# Patient Record
Sex: Male | Born: 1995 | Race: Black or African American | Hispanic: No | Marital: Single | Smoking: Never smoker
Health system: Southern US, Community
[De-identification: ages and names within clinical notes are randomized; demographics above are authoritative.]

---

## 2014-08-28 ENCOUNTER — Encounter (HOSPITAL_COMMUNITY): Payer: Self-pay | Admitting: Emergency Medicine

## 2014-08-28 ENCOUNTER — Emergency Department (HOSPITAL_COMMUNITY): Payer: Medicaid Other

## 2014-08-28 ENCOUNTER — Emergency Department (HOSPITAL_COMMUNITY)
Admission: EM | Admit: 2014-08-28 | Discharge: 2014-08-28 | Disposition: A | Discharge status: Home / Self Care | Payer: Medicaid Other | Attending: Emergency Medicine | Admitting: Emergency Medicine

## 2014-08-28 DIAGNOSIS — S02609B Fracture of mandible, unspecified, initial encounter for open fracture: Secondary | ICD-10-CM | POA: Diagnosis not present

## 2014-08-28 DIAGNOSIS — S0993XA Unspecified injury of face, initial encounter: Secondary | ICD-10-CM

## 2014-08-28 DIAGNOSIS — IMO0002 Reserved for concepts with insufficient information to code with codable children: Secondary | ICD-10-CM | POA: Diagnosis not present

## 2014-08-28 DIAGNOSIS — S0083XA Contusion of other part of head, initial encounter: Secondary | ICD-10-CM | POA: Insufficient documentation

## 2014-08-28 DIAGNOSIS — S1093XA Contusion of unspecified part of neck, initial encounter: Secondary | ICD-10-CM

## 2014-08-28 DIAGNOSIS — S0003XA Contusion of scalp, initial encounter: Secondary | ICD-10-CM | POA: Diagnosis not present

## 2014-08-28 DIAGNOSIS — R220 Localized swelling, mass and lump, head: Secondary | ICD-10-CM

## 2014-08-28 DIAGNOSIS — S025XXA Fracture of tooth (traumatic), initial encounter for closed fracture: Secondary | ICD-10-CM | POA: Insufficient documentation

## 2014-08-28 MED ORDER — HYDROCODONE-ACETAMINOPHEN 5-325 MG PO TABS: 1.0000 | ORAL_TABLET | Freq: Four times a day (QID) | ORAL | Status: DC | PRN

## 2014-08-28 MED ORDER — AMOXICILLIN 500 MG PO TABS: 500.0000 mg | ORAL_TABLET | Freq: Two times a day (BID) | ORAL | Status: DC

## 2014-08-28 MED ORDER — NAPROXEN 500 MG PO TABS: 500.0000 mg | ORAL_TABLET | Freq: Two times a day (BID) | ORAL | Status: DC | PRN

## 2014-08-28 MED ORDER — HYDROCODONE-ACETAMINOPHEN 5-325 MG PO TABS
1.0000 | ORAL_TABLET | Freq: Once | ORAL | Status: AC
  Administered 2014-08-28: 1 via ORAL
  Filled 2014-08-28: qty 1
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
## 2014-08-28 NOTE — ED Notes (Signed)
Pt alert and oriented at discharge.  Pt ambulatory to the waiting room with this RN.  Taken home by friend.
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
## 2014-08-28 NOTE — Procedures (Signed)
*   No surgery found *  8:51 AM  PATIENT: Lonnie Underwood  17 y.o.     PROCEDURE: CLOSED REDUCTION MANDIBLE FRACTURE, STABILIZATION AVULSED TEETH, SUTURE ORAL LACERATION   SURGEON:  Philopater Mucha M. Piper [place]sebrock, dmd  ANESTHESIA:   local   Description of procedure: Local anesthesia 2% lidocaine 1:200,000 epi x 8 cc bilateral mandibular blocks, anterior infiltration. Teeth repositioned into sockets under manual pressure. Alveolar fracture reduced manually. Removed loose bracket tooth #23. 24 ga. Wire stabilization between 23 and 24. Sutured gingival interproximal tissue to close open fracture in anterior incisor area with 4-0 chromic. Tolerated well. No complications. Post op instructions given.  PLAN OF CARE: Discharge to home from ER  PATIENT DISPOSITION:  PACU - hemodynamically stable.   PROCEDURE DETAILS: Dictation #  Georgia Lopes, DMD 08/28/2014 8:51 AM

## 2014-08-29 NOTE — ED Provider Notes (Signed)
I agree with plan, surgeon evaluated in ED.  See separate note.   Lonnie Underwood   Lonnie Skeens, MD 08/29/14 [phone]

## 2016-02-29 ENCOUNTER — Encounter (HOSPITAL_COMMUNITY): Payer: Self-pay | Admitting: Emergency Medicine

## 2016-02-29 ENCOUNTER — Emergency Department (HOSPITAL_COMMUNITY)
Admission: EM | Admit: 2016-02-29 | Discharge: 2016-02-29 | Disposition: A | Discharge status: Home / Self Care | Payer: Medicaid Other | Attending: Emergency Medicine | Admitting: Emergency Medicine

## 2016-02-29 DIAGNOSIS — K0889 Other specified disorders of teeth and supporting structures: Secondary | ICD-10-CM | POA: Diagnosis present

## 2016-02-29 DIAGNOSIS — K120 Recurrent oral aphthae: Secondary | ICD-10-CM | POA: Diagnosis not present

## 2016-02-29 MED ORDER — BENZOCAINE 20 % MT GEL: 1.0000 "application " | Freq: Three times a day (TID) | OROMUCOSAL | Status: DC | PRN

## 2016-02-29 NOTE — ED Notes (Signed)
Pt presents to ed for evaluation of a sore developing in his mouth in the mid, lower gums.  NAD

## 2016-02-29 NOTE — ED Notes (Signed)
Patient able to ambulate independently  

## 2016-02-29 NOTE — ED Provider Notes (Signed)
CSN: [id number]     Arrival date & time 02/29/16  1928 History  By signing my name below, I, Soijett Blue, attest that this documentation has been prepared under the direction and in the presence of Will Sophiah Rolin, PA-C Electronically Signed: Soijett Blue, ED Scribe. 02/29/2016. 8:00 PM.   Chief Complaint  Patient presents with  . Dental Problem     The history is provided by the patient. No language interpreter was used.    Lonnie Underwood is a 20 y.o. male who presents to the Emergency Department complaining of dental problem onset 1.5 weeks. He notes that he has canker sores to his bottom inner lip. Pt has had similar symptoms 2 years ago that resolved. Pt has his braces checked by Dr. Cliffton Asters in [place], [place] with his last visit being last month. He states that has tried warm salt water gargles without any medications for the relief for his symptoms. He denies gum swelling/bleeding, dental pain, sore throat, fever, chills, neck pain, ear pain, eye pain, and any other symptoms.    History reviewed. No pertinent past medical history. History reviewed. No pertinent past surgical history. History reviewed. No pertinent family history. Social History  Substance Use Topics  . Smoking status: Never Smoker   . Smokeless tobacco: Never Used  . Alcohol Use: Yes     Comment: occasionally    Review of Systems  Constitutional: Negative for fever and chills.  HENT: Positive for dental problem and mouth sores (canker). Negative for ear pain, facial swelling, sore throat and trouble swallowing.   Eyes: Negative for pain.  Skin: Negative for rash.    Allergies  Review of patient's allergies indicates no known allergies.  Home Medications   Prior to Admission medications   Medication Sig Start Date End Date Taking? Authorizing Provider  amoxicillin (AMOXIL) 500 MG tablet Take 1 tablet (500 mg total) by mouth 2 (two) times daily. For 10 days 08/28/14   Mercedes Camprubi-Soms, PA-C  benzocaine (ORABASE  MAXIMUM STRENGTH) 20 % GEL Use as directed 1 application in the mouth or throat 3 (three) times daily as needed. 02/29/16   Everlene Farrier, PA-C  HYDROcodone-acetaminophen (NORCO) 5-325 MG per tablet Take 1-2 tablets by mouth every 6 (six) hours as needed for severe pain. 08/28/14   Mercedes Camprubi-Soms, PA-C  naproxen (NAPROSYN) 500 MG tablet Take 1 tablet (500 mg total) by mouth 2 (two) times daily as needed for mild pain, moderate pain or headache (TAKE WITH MEALS.). 08/28/14   Mercedes Camprubi-Soms, PA-C   BP 143/74 mmHg  Pulse 64  Temp(Src) 98.5 F (36.9 C) (Oral)  Resp 16  SpO2 100% Physical Exam  Constitutional: He appears well-developed and well-nourished. No distress.  Nontoxic appearing.  HENT:  Head: Normocephalic and atraumatic.  Right Ear: External ear normal.  Left Ear: External ear normal.  Nose: Nose normal.  Mouth/Throat: Oropharynx is clear and moist. No oropharyngeal exudate.   Several small localized, shallow, round to oval ulcers with a grayish base to his lower lip inner mucosa.  Patient has braces. No tonsillar hypertrophy or exudate. Uvula is midline without edema. No peritonsillar abscess. No trismus. No drooling. Tongue protrusion is is normal.  Eyes: Conjunctivae are normal. Pupils are equal, round, and reactive to light. Right eye exhibits no discharge. Left eye exhibits no discharge.  Neck: Normal range of motion. Neck supple. No JVD present. No tracheal deviation present.  Cardiovascular: Normal rate, regular rhythm and intact distal pulses.   Pulmonary/Chest: Effort normal. No respiratory  distress.  Lymphadenopathy:    He has no cervical adenopathy.  Neurological: He is alert. Coordination normal.  Skin: Skin is warm and dry. No rash noted. He is not diaphoretic. No erythema. No pallor.  Psychiatric: He has a normal mood and affect. His behavior is normal.  Nursing note and vitals reviewed.   ED Course  Procedures (including critical care  time) DIAGNOSTIC STUDIES: Oxygen Saturation is 100% on RA, nl by my interpretation.    COORDINATION OF CARE: 7:51 PM Discussed treatment plan with pt at bedside which includes benzocaine Rx and pt agreed to plan.    Labs Review Labs Reviewed - No data to display  Imaging Review No results found.    EKG Interpretation None      Filed Vitals:   02/29/16 1932  BP: 143/74  Pulse: 64  Temp: 98.5 F (36.9 C)  TempSrc: Oral  Resp: 16  SpO2: 100%     MDM   Meds given in ED:  Medications - No data to display  Discharge Medication List as of 02/29/2016  7:59 PM    START taking these medications   Details  benzocaine (ORABASE MAXIMUM STRENGTH) 20 % GEL Use as directed 1 application in the mouth or throat 3 (three) times daily as needed., Starting 02/29/2016, Until Discontinued, Print        Final diagnoses:  Aphthous ulcer of mouth   This is a 20 y.o. male who presents to the Emergency Department complaining of dental problem onset 1.5 weeks. He notes that he has canker sores to his bottom inner lip. Pt has had similar symptoms 2 years ago that resolved. Pt has his braces checked by Dr. Cliffton AstersWhite in [place], [place] with his last visit being last month.  On exam the patient is afebrile nontoxic appearing. Patient has several aphthous ulcers noted to his lower lip mucosa. No other lesions noted. I educated on aphthous ulcers. Will provide with prescription for Orabase. I encouraged him to follow-up with his orthodontist and dentist. I advised the patient to follow-up with their primary care provider this week. I advised the patient to return to the emergency department with new or worsening symptoms or new concerns. The patient verbalized understanding and agreement with plan.    I personally performed the services described in this documentation, which was scribed in my presence. The recorded information has been reviewed and is accurate.        Everlene FarrierWilliam Bani Gianfrancesco, PA-C 02/29/16  2008  Pricilla LovelessScott Goldston, MD 03/06/16 [phone]

## 2016-02-29 NOTE — Discharge Instructions (Signed)
Canker Sores °Canker sores are small, painful sores that develop inside your mouth. They may also be called aphthous ulcers. You can get canker sores on the inside of your lips or cheeks, on your tongue, or anywhere inside your mouth. You can have just one canker sore or several of them. Canker sores cannot be passed from one person to another (noncontagious). These sores are different than the sores that you may get on the outside of your lips (cold sores or fever blisters). °Canker sores usually start as painful red bumps. Then they turn into small white, yellow, or gray ulcers that have red borders. The ulcers may be quite painful. The pain may be worse when you eat or drink. °CAUSES °The cause of this condition is not known. °RISK FACTORS °This condition is more likely to develop in: °· Women. °· People in their teens or 20s. °· Women who are having their menstrual period. °· People who are under a lot of emotional stress. °· People who do not get enough iron or B vitamins. °· People who have poor oral hygiene. °· People who have an injury inside the mouth. This can happen after having dental work or from chewing something hard. °SYMPTOMS °Along with the canker sore, symptoms may also include: °· Fever. °· Fatigue. °· Swollen lymph nodes in your neck. °DIAGNOSIS °This condition can be diagnosed based on your symptoms. Your health care provider will also examine your mouth. Your health care provider may also do tests if you get canker sores often or if they are very bad. Tests may include: °· Blood tests to rule out other causes of canker sores. °· Taking swabs from the sore to check for infection. °· Taking a small piece of skin from the sore (biopsy) to test it for cancer. °TREATMENT °Most canker sores clear up without treatment in about 10 days. Home care is usually the only treatment that you will need. Over-the-counter medicines can relieve discomfort. If you have severe canker sores, your health care  provider may prescribe: °· Numbing ointment to relieve pain. °· Vitamins. °· Steroid medicines. These may be given as: °¨ Oral pills. °¨ Mouth rinses. °¨ Gels. °· Antibiotic mouth rinse. °HOME CARE INSTRUCTIONS °· Apply, take, or use medicines only as directed by your health care provider. These include vitamins. °· If you were prescribed an antibiotic mouth rinse, finish all of it even if you start to feel better. °· Until the sores are healed: °¨ Do not drink coffee or citrus juices. °¨ Do not eat spicy or salty foods. °· Use a mild, over-the-counter mouth rinse as directed by your health care provider. °· Practice good oral hygiene. °¨ Floss your teeth every day. °¨ Brush your teeth with a soft brush twice each day. °SEEK MEDICAL CARE IF: °· Your symptoms do not get better after two weeks. °· You also have a fever or swollen glands. °· You get canker sores often. °· You have a canker sore that is getting larger. °· You cannot eat or drink due to your canker sores. °  °This information is not intended to replace advice given to you by your health care provider. Make sure you discuss any questions you have with your health care provider. °  °Document Released: 04/05/2011 Document Revised: 04/25/2015 Document Reviewed: 11/09/2014 °Elsevier Interactive Patient Education ©2016 [employer] ° °

## 2017-01-27 ENCOUNTER — Emergency Department (HOSPITAL_COMMUNITY)
Admission: EM | Admit: 2017-01-27 | Discharge: 2017-01-27 | Disposition: A | Discharge status: Home / Self Care | Payer: No Typology Code available for payment source | Attending: Emergency Medicine | Admitting: Emergency Medicine

## 2017-01-27 ENCOUNTER — Encounter (HOSPITAL_COMMUNITY): Payer: Self-pay | Admitting: *Deleted

## 2017-01-27 ENCOUNTER — Emergency Department (HOSPITAL_COMMUNITY): Payer: No Typology Code available for payment source

## 2017-01-27 DIAGNOSIS — Z79899 Other long term (current) drug therapy: Secondary | ICD-10-CM | POA: Insufficient documentation

## 2017-01-27 DIAGNOSIS — M542 Cervicalgia: Secondary | ICD-10-CM

## 2017-01-27 DIAGNOSIS — S199XXA Unspecified injury of neck, initial encounter: Secondary | ICD-10-CM | POA: Diagnosis present

## 2017-01-27 DIAGNOSIS — Y999 Unspecified external cause status: Secondary | ICD-10-CM | POA: Diagnosis not present

## 2017-01-27 DIAGNOSIS — Y939 Activity, unspecified: Secondary | ICD-10-CM | POA: Insufficient documentation

## 2017-01-27 DIAGNOSIS — Y92410 Unspecified street and highway as the place of occurrence of the external cause: Secondary | ICD-10-CM | POA: Diagnosis not present

## 2017-01-27 MED ORDER — IBUPROFEN 400 MG PO TABS
600.0000 mg | ORAL_TABLET | Freq: Once | ORAL | Status: AC
  Administered 2017-01-27: 600 mg via ORAL
  Filled 2017-01-27: qty 1

## 2017-01-27 MED ORDER — IBUPROFEN 600 MG PO TABS: 600.0000 mg | ORAL_TABLET | Freq: Four times a day (QID) | ORAL | 0 refills | Status: DC | PRN

## 2017-01-27 MED ORDER — CYCLOBENZAPRINE HCL 10 MG PO TABS: 10.0000 mg | ORAL_TABLET | Freq: Two times a day (BID) | ORAL | 0 refills | Status: DC | PRN

## 2017-01-27 NOTE — Discharge Instructions (Signed)
Take anti-inflammatory medicines for the next week. Take this medicine with food. °Take muscle relaxer at bedtime to help you sleep. This medicine makes you drowsy so do not take before driving or work °Use a heating pad for sore muscles - use for 20 minutes several times a day ° °
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
## 2018-07-10 ENCOUNTER — Ambulatory Visit (HOSPITAL_COMMUNITY)
Admission: EM | Admit: 2018-07-10 | Discharge: 2018-07-10 | Disposition: A | Discharge status: Home / Self Care | Payer: Self-pay | Attending: Internal Medicine | Admitting: Internal Medicine

## 2018-07-10 ENCOUNTER — Encounter (HOSPITAL_COMMUNITY): Payer: Self-pay

## 2018-07-10 ENCOUNTER — Ambulatory Visit (INDEPENDENT_AMBULATORY_CARE_PROVIDER_SITE_OTHER): Payer: Self-pay

## 2018-07-10 DIAGNOSIS — M79642 Pain in left hand: Secondary | ICD-10-CM

## 2018-07-10 DIAGNOSIS — R2231 Localized swelling, mass and lump, right upper limb: Secondary | ICD-10-CM

## 2018-07-10 DIAGNOSIS — M79641 Pain in right hand: Secondary | ICD-10-CM

## 2018-07-10 DIAGNOSIS — R2232 Localized swelling, mass and lump, left upper limb: Secondary | ICD-10-CM
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
## 2018-07-10 NOTE — Discharge Instructions (Addendum)
X-ray negative for fracture or dislocation.  As discussed you can take ibuprofen 800 mg 3 times a day or naproxen 440 mg twice a day for the next 10 days to help with symptoms. You can use a wrist brace to help with the pain during activity.  Ice compress, rest.  This may take a few weeks to completely resolve, but should be feeling better each week.  Follow-up with orthopedics for further evaluation if strength and movement does not improve/go back to normal.
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
## 2019-07-27 ENCOUNTER — Encounter: Payer: Self-pay | Admitting: Emergency Medicine

## 2019-07-27 ENCOUNTER — Ambulatory Visit
Admission: EM | Admit: 2019-07-27 | Discharge: 2019-07-27 | Disposition: A | Discharge status: Home / Self Care | Payer: Self-pay | Attending: Physician Assistant | Admitting: Physician Assistant

## 2019-07-27 ENCOUNTER — Ambulatory Visit (INDEPENDENT_AMBULATORY_CARE_PROVIDER_SITE_OTHER): Payer: Self-pay

## 2019-07-27 ENCOUNTER — Other Ambulatory Visit: Payer: Self-pay

## 2019-07-27 DIAGNOSIS — S62336A Displaced fracture of neck of fifth metacarpal bone, right hand, initial encounter for closed fracture: Secondary | ICD-10-CM
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
## 2019-07-27 NOTE — ED Notes (Signed)
Patient able to ambulate independently  

## 2019-07-27 NOTE — ED Provider Notes (Addendum)
[facility]    CSN: [id number] Arrival date & time: 07/27/19  [place]     History   Chief Complaint Chief Complaint  Patient presents with  . Hand Pain    HPI Lonnie Underwood is a 23 y.o. male.   23 year old male comes in for 4-day history of right hand injury. Patient states was moving furniture and smashed his right hand between a dresser and a door frame. States went to bed without much thought until the next day, when he noticed swelling to the ulna expect of the hand and unable to move the tip of the 5th finger. Denies numbness/tingling. States pain has been able to be controlled. Has not taken anything for the symptoms.      History reviewed. No pertinent past medical history.  There are no active problems to display for this patient.   History reviewed. No pertinent surgical history.     Home Medications    Prior to Admission medications   Not on File    Family History Family History  Problem Relation Age of Onset  . Healthy Mother   . Healthy Father     Social History Social History   Tobacco Use  . Smoking status: Never Smoker  . Smokeless tobacco: Never Used  Substance Use Topics  . Alcohol use: Yes    Comment: occasionally  . Drug use: No     Allergies   Patient has no known allergies.   Review of Systems Review of Systems  Reason unable to perform ROS: See HPI as above.     Physical Exam Triage Vital Signs ED Triage Vitals [07/27/19 1842]  Enc Vitals Group     BP 135/80     Pulse Rate (!) 56     Resp 18     Temp 98.2 F (36.8 C)     Temp Source Oral     SpO2 97 %     Weight      Height      Head Circumference      Peak Flow      Pain Score 4     Pain Loc      Pain Edu?      Excl. in [place]?    No data found.  Updated Vital Signs BP 135/80 (BP Location: Left Arm)   Pulse (!) 56   Temp 98.2 F (36.8 C) (Oral)   Resp 18   SpO2 97%   Physical Exam Constitutional:      General: He is not in acute distress.     Appearance: He is well-developed. He is not diaphoretic.  HENT:     Head: Normocephalic and atraumatic.  Eyes:     Conjunctiva/sclera: Conjunctivae normal.     Pupils: Pupils are equal, round, and reactive to light.  Pulmonary:     Effort: Pulmonary effort is normal. No respiratory distress.  Musculoskeletal:     Comments: Swelling with contusion to the 4th-5th MCP with tenderness to palpation of distal 5th MCP. No tenderness to palpation of 5th finger. 5th DIP flexed and unable to be extend. Able to extend and flex 5th PIP/MCP joint. Sensation intact. Radial pulse 2+, cap refill <2s  Neurological:     Mental Status: He is alert and oriented to person, place, and time.      UC Treatments / Results  Labs (all labs ordered are listed, but only abnormal results are displayed) Labs Reviewed - No data to display  EKG   Radiology  Dg Hand Complete Right  Result Date: 07/27/2019 CLINICAL DATA:  Right hand injury, pain EXAM: RIGHT HAND - COMPLETE 3+ VIEW COMPARISON:  None. FINDINGS: There is a fracture through the right 5th metacarpal. Minimal displacement and angulation. No subluxation or dislocation. IMPRESSION: Minimally displaced and angulated right 5th metacarpal fracture Electronically Signed   By: Charlett NoseKevin  Dover M.D.   On: 07/27/2019 19:09    Procedures Procedures (including critical care time)  Medications Ordered in UC Medications - No data to display  Initial Impression / Assessment and Plan / UC Course  I have reviewed the triage vital signs and the nursing notes.  Pertinent labs & imaging results that were available during my care of the patient were reviewed by me and considered in my medical decision making (see chart for details).    Discussed xray result with patient. Also concerning for extensor tendon injury/rupture of the 5th DIP. However, given 4 day history, will have patient follow up with hand ortho tomorrow. No splinting materials available. Attempted to  stabilize with finger splint. Hand ortho information provided. Patient to contact this office if unable to see hand tomorrow. Patient expresses understanding and agrees to plan.  Final Clinical Impressions(s) / UC Diagnoses   Final diagnoses:  Displaced fracture of neck of fifth metacarpal bone, right hand, initial encounter for closed fracture   ED Prescriptions    None        Lurline IdolYu, Amy V, PA-C 07/27/19 1929    Belinda FisherYu, Amy V, PA-C 07/27/19 2326

## 2019-11-24 DIAGNOSIS — Z0000 Encounter for general adult medical examination without abnormal findings: Secondary | ICD-10-CM | POA: Diagnosis not present

## 2019-12-14 DIAGNOSIS — Z20828 Contact with and (suspected) exposure to other viral communicable diseases: Secondary | ICD-10-CM | POA: Diagnosis not present

## 2019-12-29 DIAGNOSIS — L723 Sebaceous cyst: Secondary | ICD-10-CM | POA: Diagnosis not present

## 2020-02-28 DIAGNOSIS — R030 Elevated blood-pressure reading, without diagnosis of hypertension: Secondary | ICD-10-CM | POA: Diagnosis not present

## 2020-02-28 DIAGNOSIS — Z13220 Encounter for screening for lipoid disorders: Secondary | ICD-10-CM | POA: Diagnosis not present

## 2020-02-28 DIAGNOSIS — L739 Follicular disorder, unspecified: Secondary | ICD-10-CM | POA: Diagnosis not present

## 2020-02-28 DIAGNOSIS — Z23 Encounter for immunization: Secondary | ICD-10-CM | POA: Diagnosis not present

## 2020-02-28 DIAGNOSIS — Z6831 Body mass index (BMI) 31.0-31.9, adult: Secondary | ICD-10-CM | POA: Diagnosis not present

## 2020-02-28 DIAGNOSIS — Z113 Encounter for screening for infections with a predominantly sexual mode of transmission: Secondary | ICD-10-CM | POA: Diagnosis not present

## 2020-02-28 DIAGNOSIS — Z0000 Encounter for general adult medical examination without abnormal findings: Secondary | ICD-10-CM | POA: Diagnosis not present

## 2021-01-11 IMAGING — DX RIGHT HAND - COMPLETE 3+ VIEW
3 series · 3 of 3 positions shown · non-contrast
Comparison: None.

CLINICAL DATA: Right hand injury, pain

EXAM:
RIGHT HAND - COMPLETE 3+ VIEW

[hand pa]
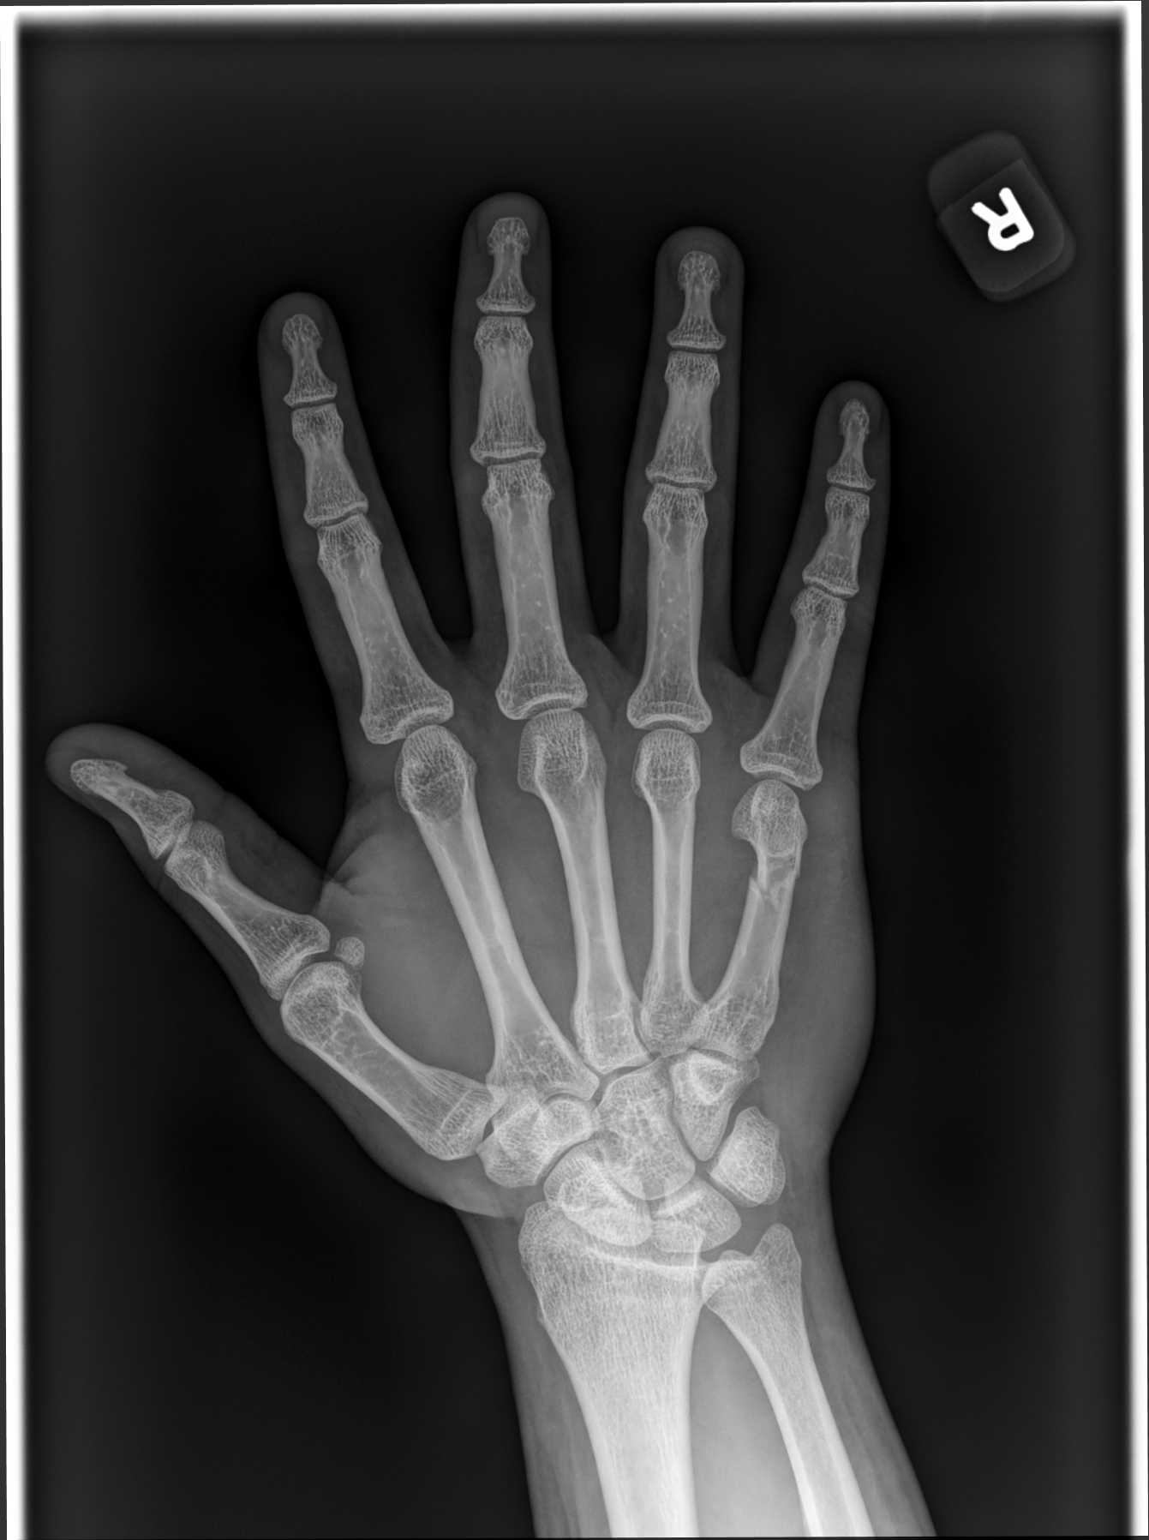

[hand mlo]
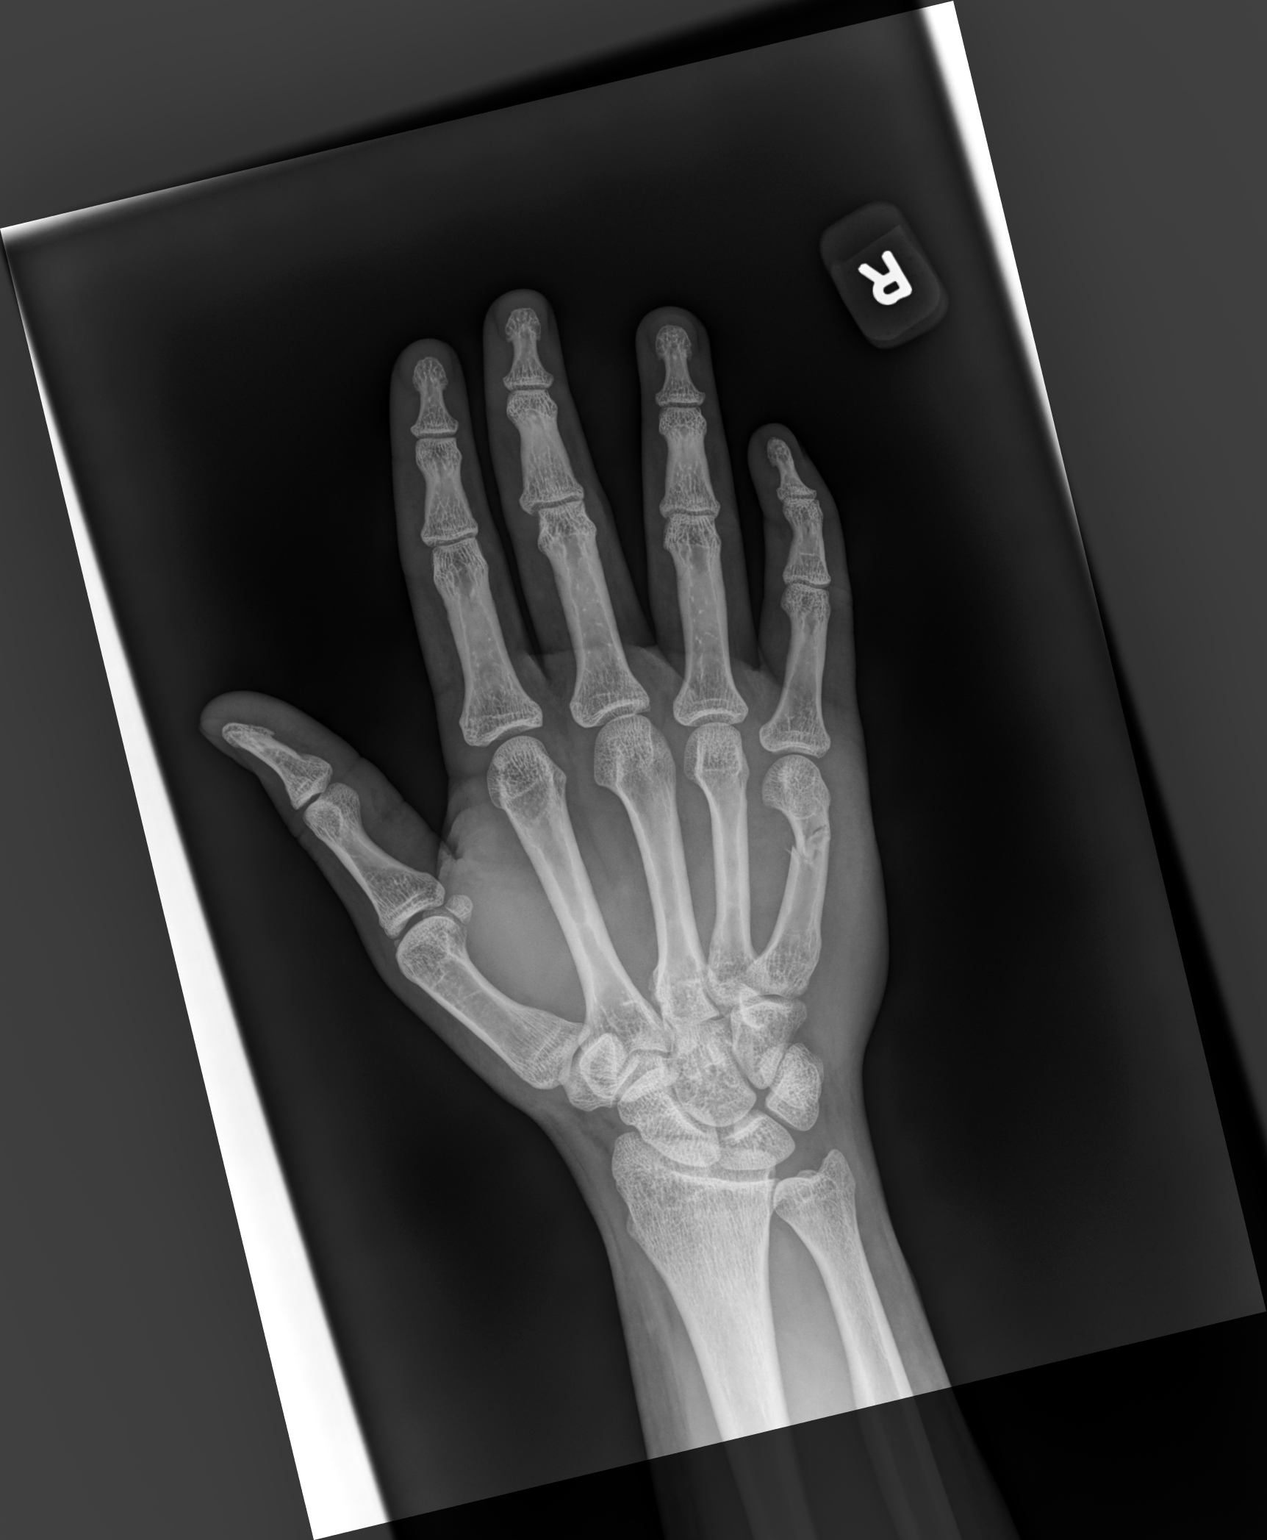

[hand lat]
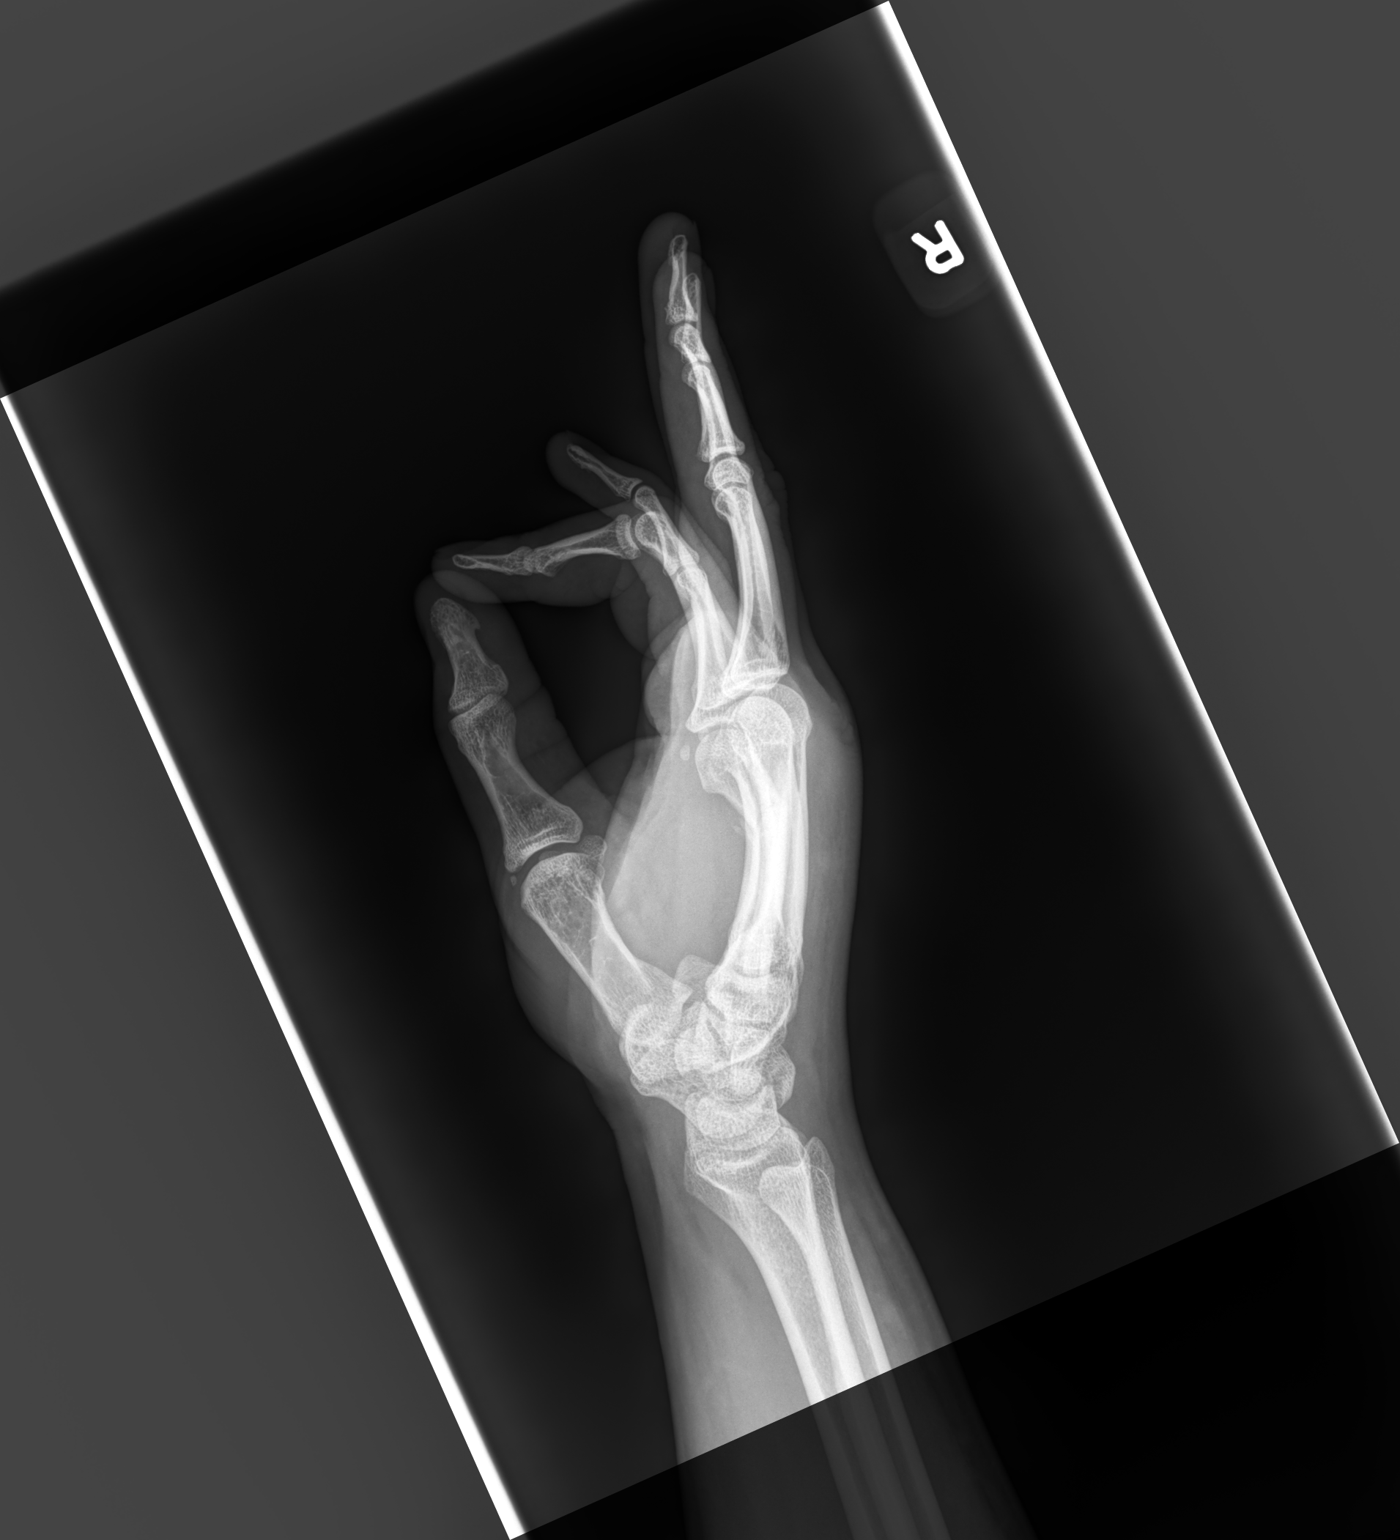

[3 of 3 positions shown; findings below may reference images not displayed]

FINDINGS: There is a fracture through the right 5th metacarpal. Minimal
displacement and angulation. No subluxation or dislocation.
IMPRESSION: Minimally displaced and angulated right 5th metacarpal fracture
